# Patient Record
Sex: Female | Born: 1995 | Race: White | Hispanic: No | Marital: Single | Smoking: Never smoker
Health system: Southern US, Community
[De-identification: ages and names within clinical notes are randomized; demographics above are authoritative.]

---
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
## 2015-02-05 DIAGNOSIS — J019 Acute sinusitis, unspecified: Secondary | ICD-10-CM | POA: Diagnosis not present

## 2015-05-10 ENCOUNTER — Ambulatory Visit (INDEPENDENT_AMBULATORY_CARE_PROVIDER_SITE_OTHER): Payer: Managed Care, Other (non HMO) | Admitting: Family Medicine

## 2015-05-10 ENCOUNTER — Encounter: Payer: Self-pay | Admitting: Family Medicine

## 2015-05-10 DIAGNOSIS — M545 Low back pain, unspecified: Secondary | ICD-10-CM

## 2015-05-10 MED ORDER — CYCLOBENZAPRINE HCL 5 MG PO TABS: 5.0000 mg | ORAL_TABLET | Freq: Every day | ORAL | Status: AC

## 2015-05-10 MED ORDER — NAPROXEN 500 MG PO TABS: 500.0000 mg | ORAL_TABLET | Freq: Two times a day (BID) | ORAL | Status: AC

## 2015-05-10 NOTE — Progress Notes (Addendum)
Patient ID: Kim ArntSusannah Andrus, female   DOB: 07/23/1995, 20 y.o.   MRN: [id number]  SUBJECTIVE:  Kim Davenport is a 20 y.o. female who complains of low back pain for approximately 3 weeks. Her symptoms are mostly with bending forward. She denies any radicular symptoms. She denies any injury or trauma to her back. She did have a long ride during spring break for about 7 hours which she contributes to when her back pain started. She denies any history of stress injury or known vitamin D deficiency. Sitting for long periods causes the most discomfort. No prior history of back problems. Patient denies foot drop, incontinence, fever, constipation, urinary symptoms. She has tried Tylenol and ibuprofen for his symptoms. She has been working with the training staff on core strength and hamstring flexibility. Last menstrual period approximately 3 weeks ago  OBJECTIVE: Vitals as listed. GENERAL: NAD CARDIOVASCULAR: RRR RESP: CTA B BACK: No midline tenderness, mild generalized lower lumbar tenderness bilaterally, increased at SI joints, full range of motion, increased discomfort with flexion, negative stork test, negative SLR, 5 out of 5 strength of lower extremities, mildly decreased hamstring flexibility bilaterally, normal gait.   ASSESSMENT:  Lower back pain without radicular symptoms  PLAN: Given the length of her symptoms will get imaging of her lumbar spine with x-ray, Naprosyn when necessary, Flexeril when necessary, continue core strengthening and working on hamstring flexibility. Proper lifting with avoidance of heavy lifting discussed. Consider Physical Therapy or further imaging if not improving. Seek medical attention if symptoms persist or worsen as discussed. Willdiscuss plan with training staff.

## 2015-05-10 NOTE — Addendum Note (Signed)
Addended by: Dione HousekeeperPATEL, Davisha Linthicum N on: 05/10/2015 11:47 AM   Modules accepted: Orders

## 2015-05-18 ENCOUNTER — Ambulatory Visit
Admission: RE | Admit: 2015-05-18 | Discharge: 2015-05-18 | Disposition: A | Discharge status: Home / Self Care | Payer: Managed Care, Other (non HMO) | Source: Ambulatory Visit | Attending: Family Medicine | Admitting: Family Medicine

## 2015-05-18 DIAGNOSIS — M545 Low back pain, unspecified: Secondary | ICD-10-CM

## 2015-08-30 ENCOUNTER — Ambulatory Visit (INDEPENDENT_AMBULATORY_CARE_PROVIDER_SITE_OTHER): Payer: Managed Care, Other (non HMO) | Admitting: Family Medicine

## 2015-08-30 ENCOUNTER — Encounter: Payer: Self-pay | Admitting: Family Medicine

## 2015-08-30 DIAGNOSIS — L739 Follicular disorder, unspecified: Secondary | ICD-10-CM

## 2015-08-30 MED ORDER — SULFAMETHOXAZOLE-TRIMETHOPRIM 800-160 MG PO TABS: 1.0000 | ORAL_TABLET | Freq: Two times a day (BID) | ORAL | 0 refills | Status: AC

## 2015-08-30 NOTE — Progress Notes (Signed)
Patient presents today with tender lymph node in the left axilla area. Patient states that she has some red bumps in both axilla. She has noticed this over the last few days. She denies any breast tenderness or masses. She does use a razor to shave her axilla. She denies any known history of MRSA. She denies any drainage from any of the lesions. Patient does admit to increased sweating. She also does use deodorant.  ROS: Negative except mentioned above. Vitals as per Epic. GENERAL: NAD RESP: CTA B CARD: RRR SKIN: Small erythematous vesicles in the axilla area bilaterally, a few with white heads, marble-sized tender axillary lymph node on the left, no drainage from the site NEURO: CN II-XII grossly intact   A/P: Folliculitis, lymphadenopathy - will treat patient with Bactrim DS for 10 days, I have advised the patient to keep the area clean with antibacterial soap and drying the area, I've advised her not to shave for now until the area has healed over, when she does go back to shaving she needs to use a new razor, no deodorant for now until the area is healed. If any of the areas are draining she needs to avoid physical activity until seeing me. Follow up with me if any further problems.

## 2017-09-05 IMAGING — CR DG LUMBAR SPINE COMPLETE 4+V
1 series · 5 of 5 positions shown · non-contrast
Comparison: None.

CLINICAL DATA: Non radiating lower back pain.

EXAM:
LUMBAR SPINE - COMPLETE 4+ VIEW

[Series 1: dg lumbar spine complete 4 +v · 0.14mm/px · 5 of 5 slices shown]
[im 1/5]
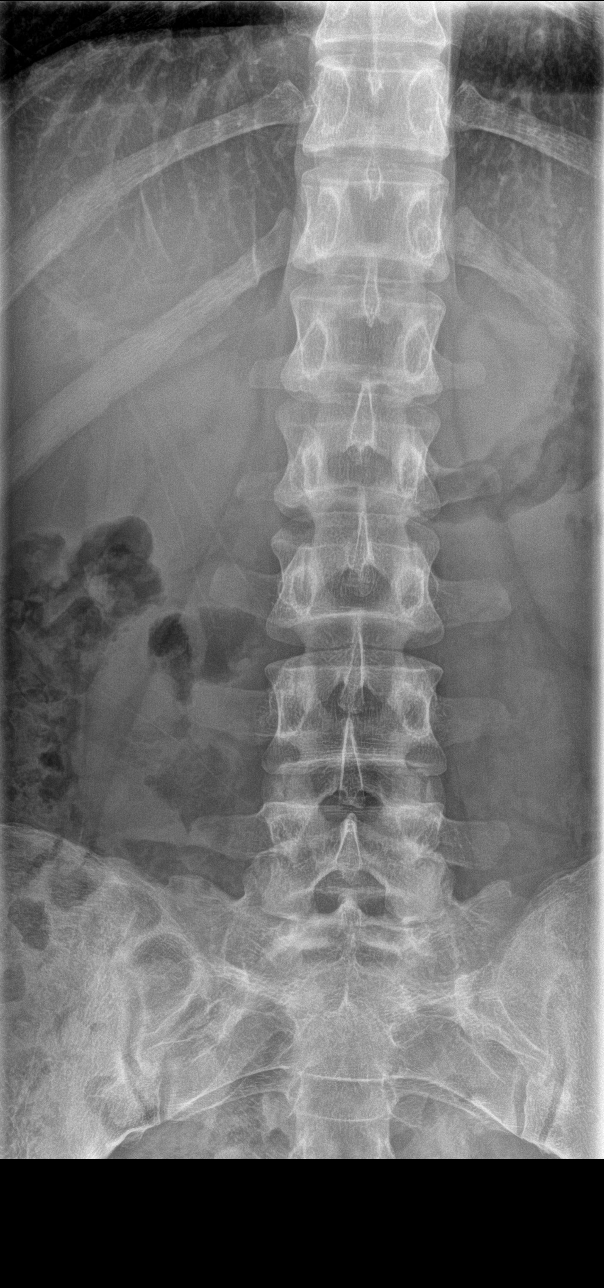
[im 2/5]
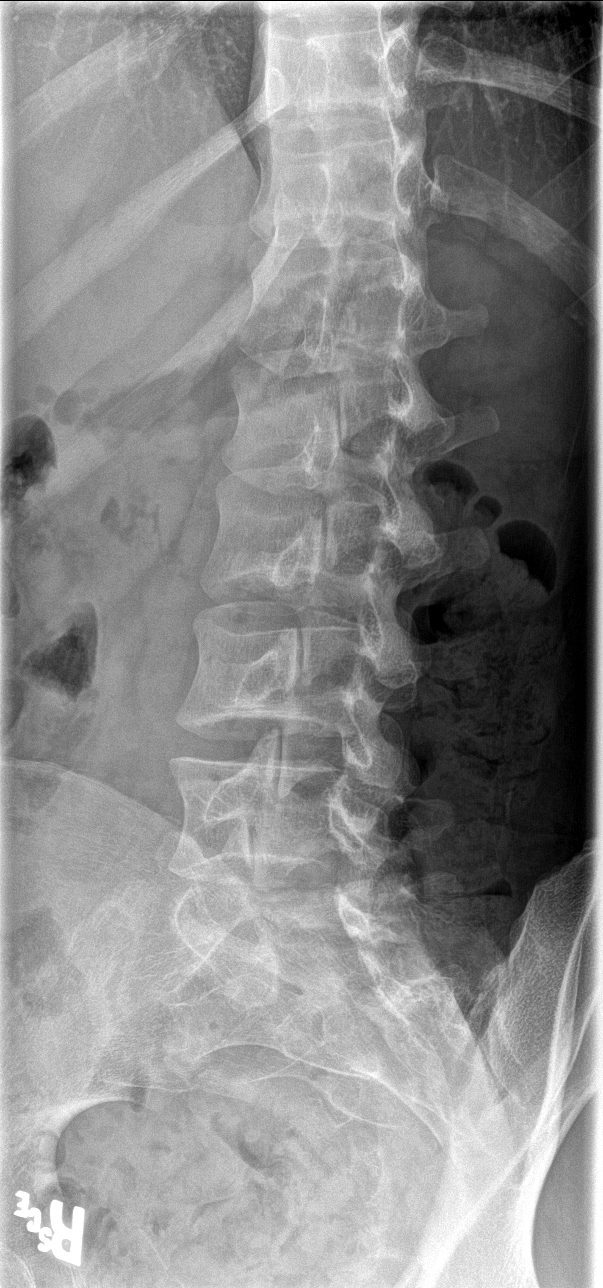
[im 3/5]
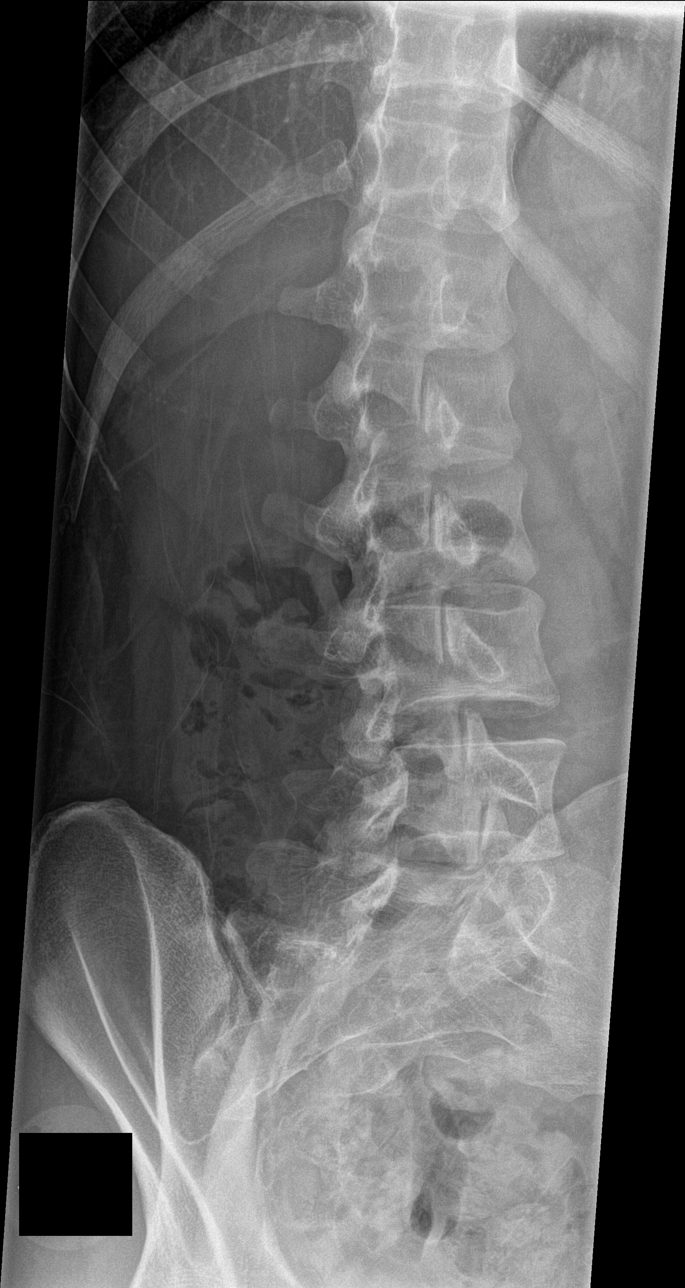
[im 4/5]
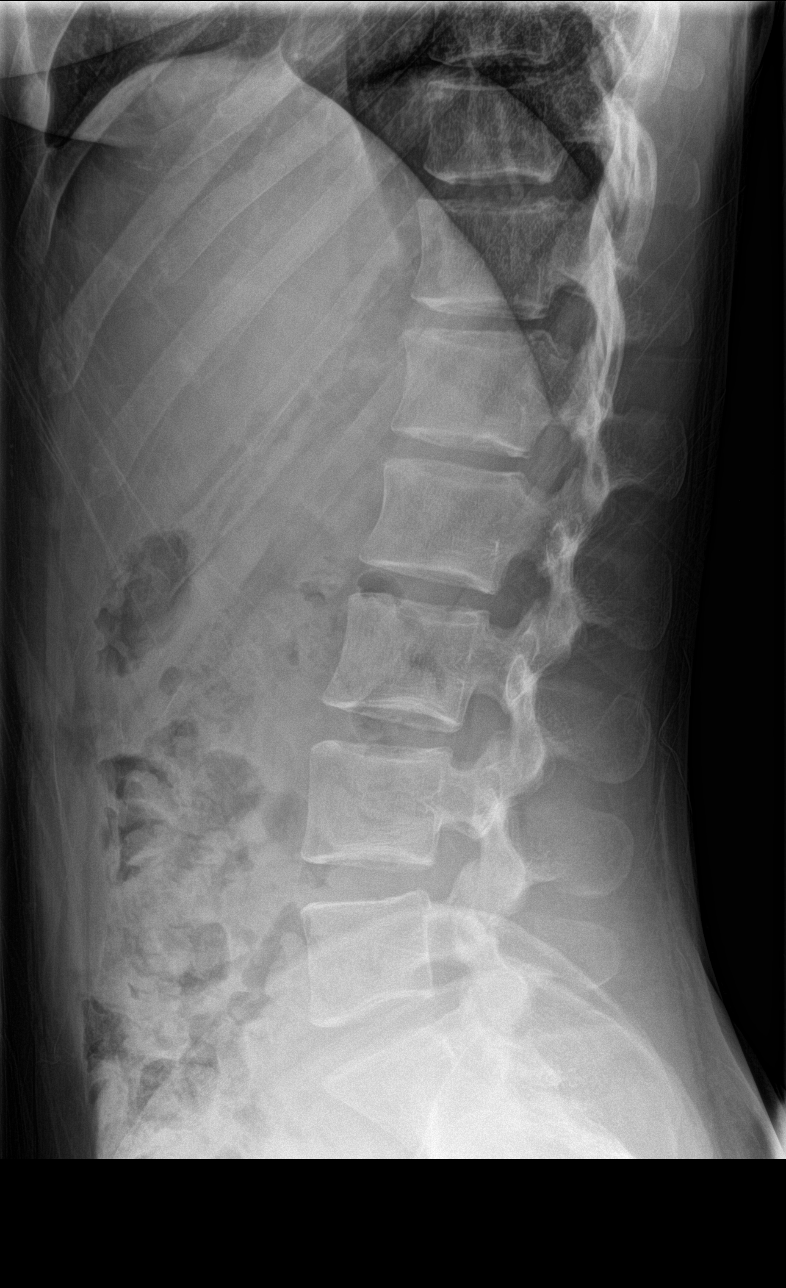
[im 5/5]
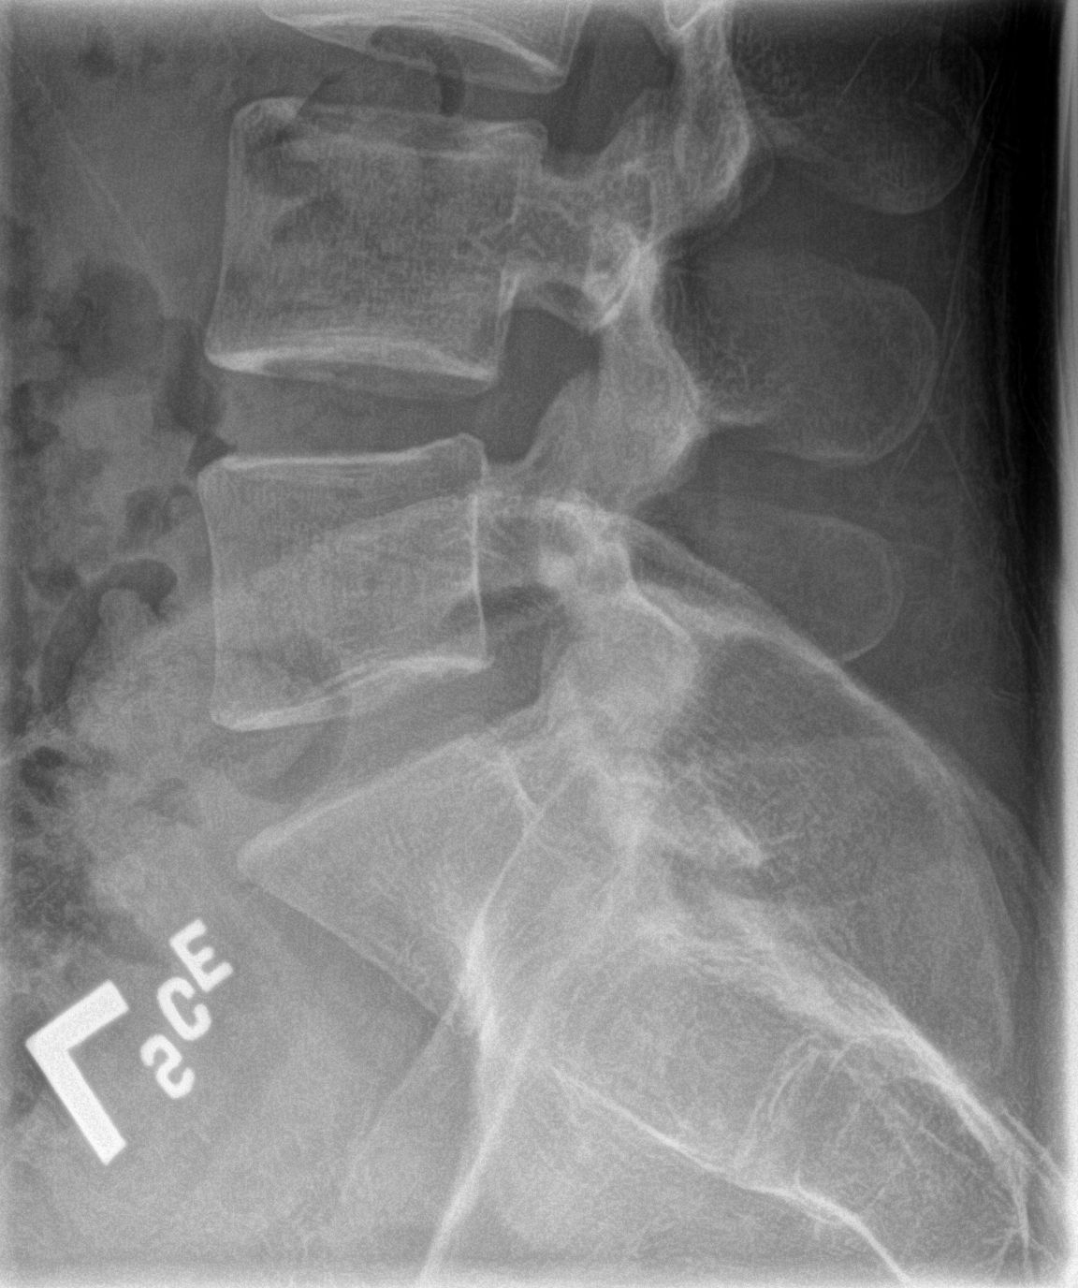

[5 of 5 positions shown; findings below may reference images not displayed]

FINDINGS: There is no evidence of lumbar spine fracture or pars defect.
Alignment is normal. Intervertebral disc spaces are maintained.
IMPRESSION: Negative.
